# Patient Record
Sex: Female | Born: 1950 | Race: Black or African American | Hispanic: No | Smoking: Former smoker
Health system: Southern US, Community
[De-identification: ages and names within clinical notes are randomized; demographics above are authoritative.]

---

## 2005-04-23 ENCOUNTER — Ambulatory Visit: Payer: Self-pay | Admitting: Internal Medicine

## 2005-10-24 ENCOUNTER — Emergency Department: Payer: Self-pay | Admitting: Emergency Medicine

## 2006-06-05 ENCOUNTER — Ambulatory Visit: Payer: Self-pay | Admitting: Internal Medicine

## 2006-11-11 ENCOUNTER — Ambulatory Visit: Payer: Self-pay | Admitting: Gastroenterology

## 2007-02-14 ENCOUNTER — Ambulatory Visit: Payer: Self-pay | Admitting: Ophthalmology

## 2007-02-24 ENCOUNTER — Ambulatory Visit: Payer: Self-pay | Admitting: Ophthalmology

## 2007-07-14 ENCOUNTER — Ambulatory Visit: Payer: Self-pay | Admitting: Internal Medicine

## 2007-10-30 ENCOUNTER — Ambulatory Visit: Payer: Self-pay | Admitting: Internal Medicine

## 2008-06-01 ENCOUNTER — Ambulatory Visit: Payer: Self-pay | Admitting: Internal Medicine

## 2009-07-24 ENCOUNTER — Ambulatory Visit: Payer: Self-pay | Admitting: Internal Medicine

## 2010-03-08 ENCOUNTER — Ambulatory Visit: Payer: Self-pay | Admitting: Gastroenterology

## 2010-09-12 ENCOUNTER — Ambulatory Visit: Payer: Self-pay | Admitting: Internal Medicine

## 2011-03-26 ENCOUNTER — Ambulatory Visit: Payer: Self-pay | Admitting: Obstetrics and Gynecology

## 2011-10-21 ENCOUNTER — Ambulatory Visit: Payer: Self-pay | Admitting: Internal Medicine

## 2012-12-04 ENCOUNTER — Ambulatory Visit: Payer: Self-pay | Admitting: Internal Medicine

## 2013-02-23 ENCOUNTER — Ambulatory Visit: Payer: Self-pay | Admitting: Obstetrics and Gynecology

## 2014-01-28 ENCOUNTER — Ambulatory Visit: Payer: Self-pay | Admitting: Internal Medicine

## 2014-10-13 ENCOUNTER — Ambulatory Visit
Admit: 2014-10-13 | Disposition: A | Discharge status: Home / Self Care | Payer: Self-pay | Attending: Internal Medicine | Admitting: Internal Medicine

## 2015-04-05 ENCOUNTER — Other Ambulatory Visit: Payer: Self-pay | Admitting: Internal Medicine

## 2015-04-05 DIAGNOSIS — Z1239 Encounter for other screening for malignant neoplasm of breast: Secondary | ICD-10-CM

## 2015-04-21 ENCOUNTER — Ambulatory Visit
Admission: RE | Admit: 2015-04-21 | Discharge: 2015-04-21 | Disposition: A | Discharge status: Home / Self Care | Payer: Medicaid Other | Source: Ambulatory Visit | Attending: Internal Medicine | Admitting: Internal Medicine

## 2015-04-21 DIAGNOSIS — Z1239 Encounter for other screening for malignant neoplasm of breast: Secondary | ICD-10-CM

## 2015-04-21 DIAGNOSIS — Z1231 Encounter for screening mammogram for malignant neoplasm of breast: Secondary | ICD-10-CM | POA: Insufficient documentation

## 2016-04-01 ENCOUNTER — Other Ambulatory Visit: Payer: Self-pay | Admitting: Internal Medicine

## 2016-04-02 ENCOUNTER — Other Ambulatory Visit: Payer: Self-pay | Admitting: Internal Medicine

## 2016-04-02 DIAGNOSIS — Z1239 Encounter for other screening for malignant neoplasm of breast: Secondary | ICD-10-CM

## 2016-05-13 ENCOUNTER — Ambulatory Visit
Admission: RE | Admit: 2016-05-13 | Discharge: 2016-05-13 | Disposition: A | Discharge status: Home / Self Care | Payer: Medicaid Other | Source: Ambulatory Visit | Attending: Internal Medicine | Admitting: Internal Medicine

## 2016-05-13 DIAGNOSIS — Z1239 Encounter for other screening for malignant neoplasm of breast: Secondary | ICD-10-CM

## 2016-05-13 DIAGNOSIS — Z1231 Encounter for screening mammogram for malignant neoplasm of breast: Secondary | ICD-10-CM | POA: Insufficient documentation

## 2017-04-17 ENCOUNTER — Other Ambulatory Visit: Payer: Self-pay | Admitting: Internal Medicine

## 2017-04-17 DIAGNOSIS — Z1231 Encounter for screening mammogram for malignant neoplasm of breast: Secondary | ICD-10-CM

## 2017-05-16 ENCOUNTER — Ambulatory Visit
Admission: RE | Admit: 2017-05-16 | Discharge: 2017-05-16 | Disposition: A | Discharge status: Home / Self Care | Payer: Medicaid Other | Source: Ambulatory Visit | Attending: Internal Medicine | Admitting: Internal Medicine

## 2017-05-16 DIAGNOSIS — Z1231 Encounter for screening mammogram for malignant neoplasm of breast: Secondary | ICD-10-CM | POA: Diagnosis not present

## 2018-01-22 ENCOUNTER — Other Ambulatory Visit: Payer: Self-pay | Admitting: Obstetrics and Gynecology

## 2018-01-22 DIAGNOSIS — Z1231 Encounter for screening mammogram for malignant neoplasm of breast: Secondary | ICD-10-CM

## 2018-04-24 ENCOUNTER — Telehealth: Payer: Self-pay | Admitting: *Deleted

## 2018-04-24 DIAGNOSIS — Z87891 Personal history of nicotine dependence: Secondary | ICD-10-CM

## 2018-04-24 DIAGNOSIS — Z122 Encounter for screening for malignant neoplasm of respiratory organs: Secondary | ICD-10-CM

## 2018-04-24 NOTE — Telephone Encounter (Signed)
Received referral for initial lung cancer screening scan. Contacted patient and obtained smoking history,(former, quit 11/2017, 44 pack year) as well as answering questions related to screening process. Patient denies signs of lung cancer such as weight loss or hemoptysis. Patient denies comorbidity that would prevent curative treatment if lung cancer were found. Patient is scheduled for shared decision making visit and CT scan on 05/18/18 at 10am.

## 2018-04-24 NOTE — Telephone Encounter (Signed)
Received referral for low dose lung cancer screening CT scan. Message left at phone number listed in EMR for patient to call me back to facilitate scheduling scan.  

## 2018-05-15 ENCOUNTER — Encounter: Payer: Self-pay | Admitting: Nurse Practitioner

## 2018-05-18 ENCOUNTER — Inpatient Hospital Stay: Payer: Medicare Other | Attending: Nurse Practitioner | Admitting: Oncology

## 2018-05-18 ENCOUNTER — Ambulatory Visit
Admission: RE | Admit: 2018-05-18 | Discharge: 2018-05-18 | Disposition: A | Discharge status: Home / Self Care | Payer: Medicare Other | Source: Ambulatory Visit | Attending: Nurse Practitioner | Admitting: Nurse Practitioner

## 2018-05-18 DIAGNOSIS — R911 Solitary pulmonary nodule: Secondary | ICD-10-CM | POA: Insufficient documentation

## 2018-05-18 DIAGNOSIS — Z122 Encounter for screening for malignant neoplasm of respiratory organs: Secondary | ICD-10-CM | POA: Diagnosis present

## 2018-05-18 DIAGNOSIS — Z87891 Personal history of nicotine dependence: Secondary | ICD-10-CM

## 2018-05-18 NOTE — Progress Notes (Signed)
In accordance with CMS guidelines, patient has met eligibility criteria including age, absence of signs or symptoms of lung cancer.  Social History   Tobacco Use  . Smoking status: Former Smoker    Packs/day: 1.00    Years: 44.00    Pack years: 44.00    Types: Cigarettes    Last attempt to quit: 11/2017    Years since quitting: 0.4  Substance Use Topics  . Alcohol use: Not on file  . Drug use: Not on file     A shared decision-making session was conducted prior to the performance of CT scan. This includes one or more decision aids, includes benefits and harms of screening, follow-up diagnostic testing, over-diagnosis, false positive rate, and total radiation exposure.  Counseling on the importance of adherence to annual lung cancer LDCT screening, impact of co-morbidities, and ability or willingness to undergo diagnosis and treatment is imperative for compliance of the program.  Counseling on the importance of continued smoking cessation for former smokers; the importance of smoking cessation for current smokers, and information about tobacco cessation interventions have been given to patient including [place] and [employer] quit [place] programs.  Written order for lung cancer screening with LDCT has been given to the patient and any and all questions have been answered to the best of my abilities.   Yearly follow up will be coordinated by Burgess Estelle, Thoracic Navigator.  Faythe Casa, NP 05/18/2018 10:21 AM

## 2018-05-19 ENCOUNTER — Ambulatory Visit
Admission: RE | Admit: 2018-05-19 | Discharge: 2018-05-19 | Disposition: A | Discharge status: Home / Self Care | Payer: Medicare Other | Source: Ambulatory Visit | Attending: Obstetrics and Gynecology | Admitting: Obstetrics and Gynecology

## 2018-05-19 DIAGNOSIS — Z1231 Encounter for screening mammogram for malignant neoplasm of breast: Secondary | ICD-10-CM | POA: Diagnosis present

## 2018-05-20 ENCOUNTER — Telehealth: Payer: Self-pay | Admitting: *Deleted

## 2018-05-20 NOTE — Telephone Encounter (Signed)
Notified patient of LDCT lung cancer screening program results with recommendation for 12 month follow up imaging. Also notified of incidental findings noted below and is encouraged to discuss further with PCP who will receive a copy of this note and/or the CT report. Patient verbalizes understanding.   IMPRESSION: Lung-RADS 2, benign appearance or behavior. Continue annual screening with low-dose chest CT without contrast in 12 months 

## 2019-03-10 ENCOUNTER — Other Ambulatory Visit: Payer: Self-pay | Admitting: Internal Medicine

## 2019-03-10 DIAGNOSIS — Z1231 Encounter for screening mammogram for malignant neoplasm of breast: Secondary | ICD-10-CM

## 2019-05-12 ENCOUNTER — Telehealth: Payer: Self-pay

## 2019-05-12 NOTE — Telephone Encounter (Signed)
Left message for patient to notify them that it is time to schedule annual low dose lung cancer screening CT scan. Instructed patient to call back ([phone]) to verify information prior to the scan being scheduled.  

## 2019-05-21 ENCOUNTER — Ambulatory Visit
Admission: RE | Admit: 2019-05-21 | Discharge: 2019-05-21 | Disposition: A | Discharge status: Home / Self Care | Payer: Medicare Other | Source: Ambulatory Visit | Attending: Internal Medicine | Admitting: Internal Medicine

## 2019-05-21 DIAGNOSIS — Z1231 Encounter for screening mammogram for malignant neoplasm of breast: Secondary | ICD-10-CM | POA: Diagnosis not present

## 2019-05-26 ENCOUNTER — Telehealth: Payer: Self-pay

## 2019-05-26 NOTE — Telephone Encounter (Signed)
Left voicemail for pt to inform her that it is time for her annual lung cancer screening. Instructed pt to call back to confirm information prior to CT scan being scheduled. 

## 2019-06-23 ENCOUNTER — Telehealth: Payer: Self-pay | Admitting: *Deleted

## 2019-06-23 DIAGNOSIS — Z87891 Personal history of nicotine dependence: Secondary | ICD-10-CM

## 2019-06-23 NOTE — Telephone Encounter (Signed)
Left message for patient to notify them that it is time to schedule annual low dose lung cancer screening CT scan. Instructed patient to call back to verify information prior to the scan being scheduled.  

## 2019-06-23 NOTE — Telephone Encounter (Signed)
Patient has been notified that annual lung cancer screening low dose CT scan is due currently or will be in near future. Confirmed that patient is within the age range of 55-77, and asymptomatic, (no signs or symptoms of lung cancer). Patient denies illness that would prevent curative treatment for lung cancer if found. Verified smoking history, ( former, quit 6/19, 44 pack year). The shared decision making visit was done 05/18/18. Patient is agreeable for CT scan being scheduled.

## 2019-06-30 ENCOUNTER — Ambulatory Visit
Admission: RE | Admit: 2019-06-30 | Discharge: 2019-06-30 | Disposition: A | Discharge status: Home / Self Care | Payer: Medicare Other | Source: Ambulatory Visit | Attending: Oncology | Admitting: Oncology

## 2019-06-30 ENCOUNTER — Other Ambulatory Visit: Payer: Self-pay

## 2019-06-30 DIAGNOSIS — Z87891 Personal history of nicotine dependence: Secondary | ICD-10-CM | POA: Diagnosis present

## 2019-07-05 ENCOUNTER — Encounter: Payer: Self-pay | Admitting: *Deleted

## 2020-02-07 ENCOUNTER — Other Ambulatory Visit: Payer: Self-pay | Admitting: Internal Medicine

## 2020-02-07 DIAGNOSIS — Z1231 Encounter for screening mammogram for malignant neoplasm of breast: Secondary | ICD-10-CM

## 2020-06-19 ENCOUNTER — Telehealth: Payer: Self-pay | Admitting: *Deleted

## 2020-06-19 NOTE — Telephone Encounter (Signed)
Attempted to contact and schedule lung screening scan. Message left for patient to call back to schedule. 

## 2020-06-20 ENCOUNTER — Other Ambulatory Visit: Payer: Self-pay | Admitting: *Deleted

## 2020-06-20 DIAGNOSIS — Z122 Encounter for screening for malignant neoplasm of respiratory organs: Secondary | ICD-10-CM

## 2020-06-20 DIAGNOSIS — Z87891 Personal history of nicotine dependence: Secondary | ICD-10-CM

## 2020-06-20 NOTE — Progress Notes (Signed)
Contacted and scheduled for annual lung screening scan. Patient is a former smoker with a 44 pack year history. Quitdate is 11/29/2017

## 2020-07-06 ENCOUNTER — Other Ambulatory Visit: Payer: Self-pay

## 2020-07-06 ENCOUNTER — Ambulatory Visit
Admission: RE | Admit: 2020-07-06 | Discharge: 2020-07-06 | Disposition: A | Discharge status: Home / Self Care | Payer: Medicare Other | Source: Ambulatory Visit | Attending: Nurse Practitioner | Admitting: Nurse Practitioner

## 2020-07-06 DIAGNOSIS — Z122 Encounter for screening for malignant neoplasm of respiratory organs: Secondary | ICD-10-CM

## 2020-07-06 DIAGNOSIS — Z87891 Personal history of nicotine dependence: Secondary | ICD-10-CM | POA: Diagnosis not present

## 2020-07-07 ENCOUNTER — Encounter: Payer: Self-pay | Admitting: *Deleted

## 2020-07-07 NOTE — Progress Notes (Signed)
error 

## 2020-11-14 ENCOUNTER — Other Ambulatory Visit: Payer: Self-pay

## 2020-11-14 ENCOUNTER — Ambulatory Visit
Admission: RE | Admit: 2020-11-14 | Discharge: 2020-11-14 | Disposition: A | Discharge status: Home / Self Care | Payer: Medicare Other | Source: Ambulatory Visit | Attending: Internal Medicine | Admitting: Internal Medicine

## 2020-11-14 DIAGNOSIS — Z1231 Encounter for screening mammogram for malignant neoplasm of breast: Secondary | ICD-10-CM | POA: Diagnosis not present

## 2021-01-17 ENCOUNTER — Ambulatory Visit: Admit: 2021-01-17 | Payer: Medicare Other | Admitting: Internal Medicine

## 2021-01-17 SURGERY — COLONOSCOPY WITH PROPOFOL
Anesthesia: General

## 2021-08-30 ENCOUNTER — Telehealth: Payer: Self-pay | Admitting: *Deleted

## 2021-08-30 NOTE — Telephone Encounter (Signed)
LMTC to schedule yearly Lung CA CT Scan. ?

## 2021-08-31 ENCOUNTER — Other Ambulatory Visit: Payer: Self-pay

## 2021-08-31 DIAGNOSIS — Z87891 Personal history of nicotine dependence: Secondary | ICD-10-CM

## 2021-09-13 ENCOUNTER — Ambulatory Visit
Admission: RE | Admit: 2021-09-13 | Discharge: 2021-09-13 | Disposition: A | Discharge status: Home / Self Care | Payer: Medicare Other | Source: Ambulatory Visit | Attending: Acute Care | Admitting: Acute Care

## 2021-09-13 DIAGNOSIS — Z87891 Personal history of nicotine dependence: Secondary | ICD-10-CM

## 2021-09-17 ENCOUNTER — Other Ambulatory Visit: Payer: Self-pay

## 2021-09-17 DIAGNOSIS — Z87891 Personal history of nicotine dependence: Secondary | ICD-10-CM

## 2021-09-24 ENCOUNTER — Other Ambulatory Visit: Payer: Self-pay | Admitting: Obstetrics and Gynecology

## 2021-09-24 DIAGNOSIS — Z1231 Encounter for screening mammogram for malignant neoplasm of breast: Secondary | ICD-10-CM

## 2021-10-29 IMAGING — MG DIGITAL SCREENING BILAT W/ TOMO W/ CAD
8 series · 9 of 24 positions shown · non-contrast
Comparison: Previous exam(s).

CLINICAL DATA: Screening.

EXAM:
DIGITAL SCREENING BILATERAL MAMMOGRAM WITH TOMO AND CAD

[R MLO synth-2D]
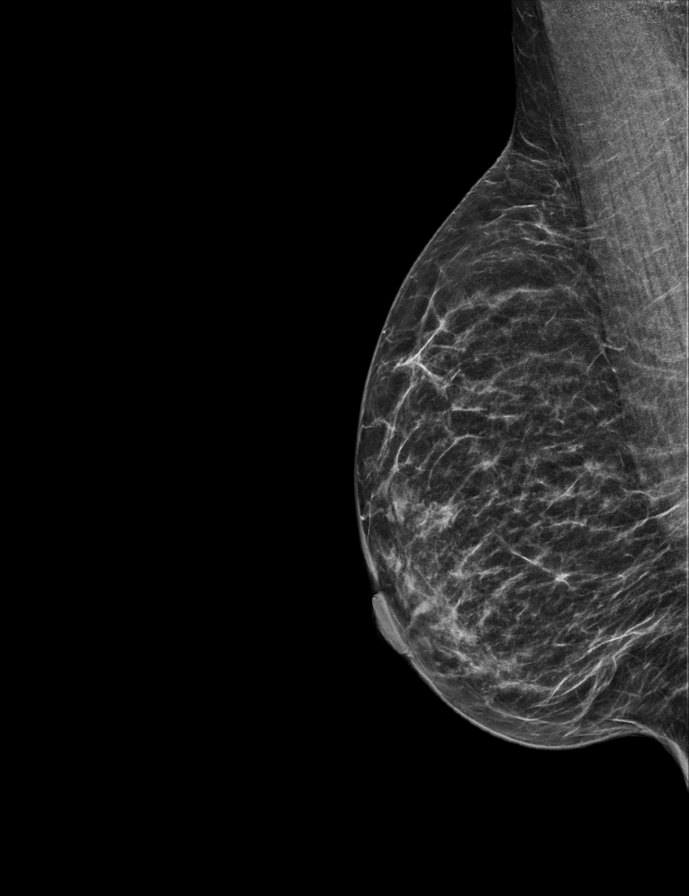

[R CC synth-2D]
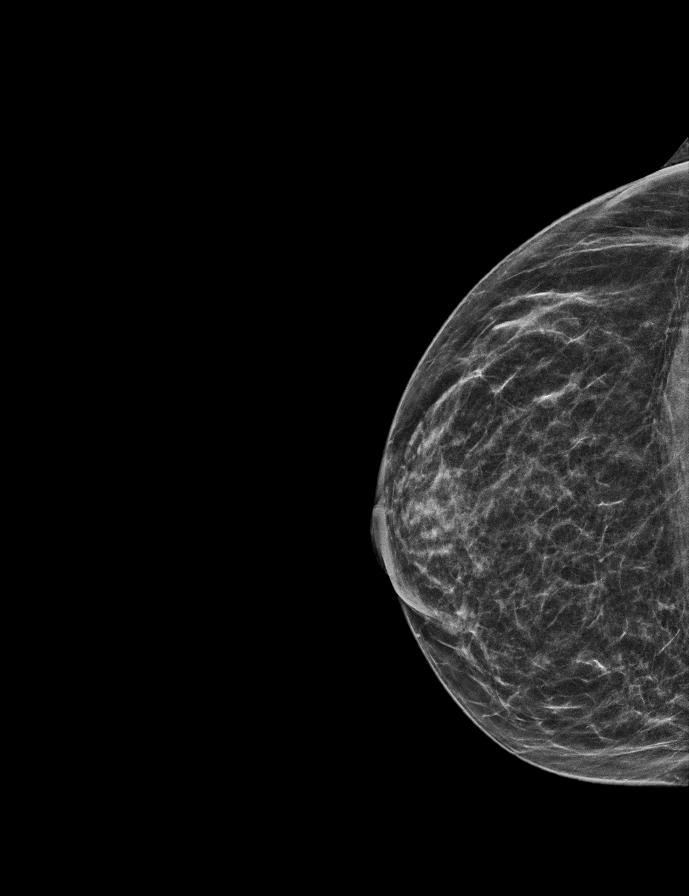

[L MLO synth-2D]
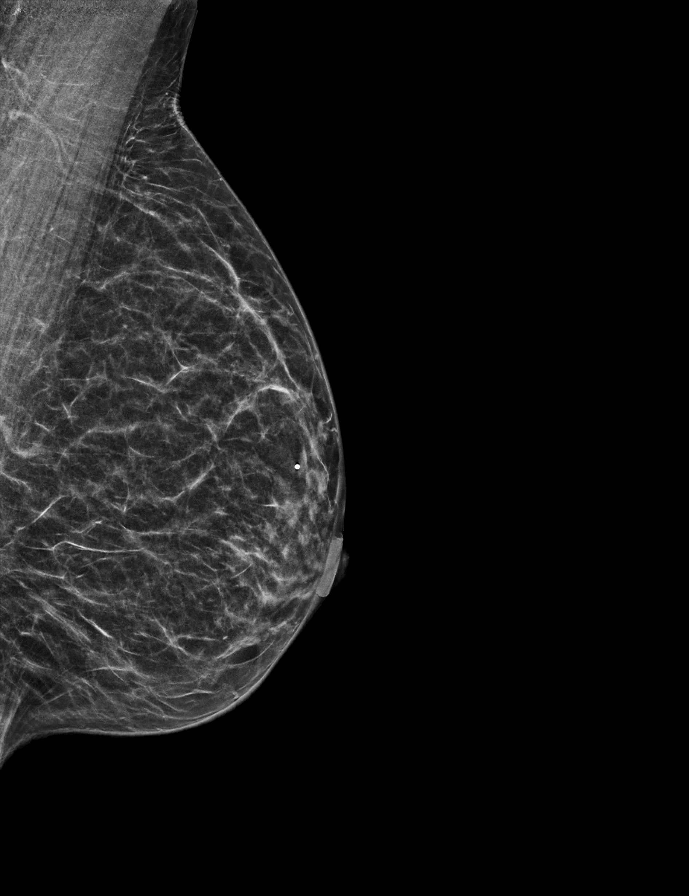

[L CC synth-2D]
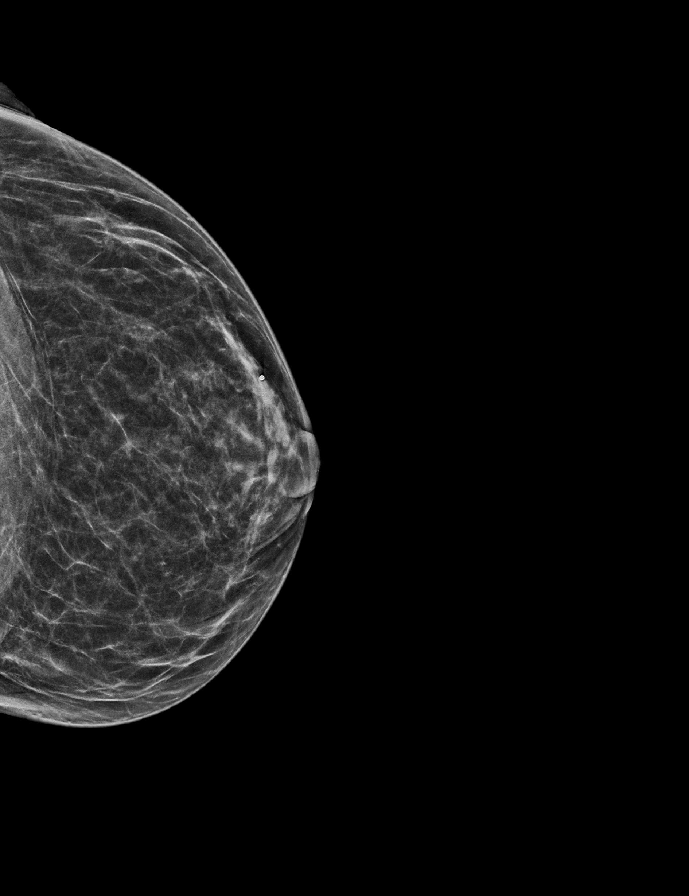

[R MLO tomo · 2 of 41 frames shown]
[frame 14/41]
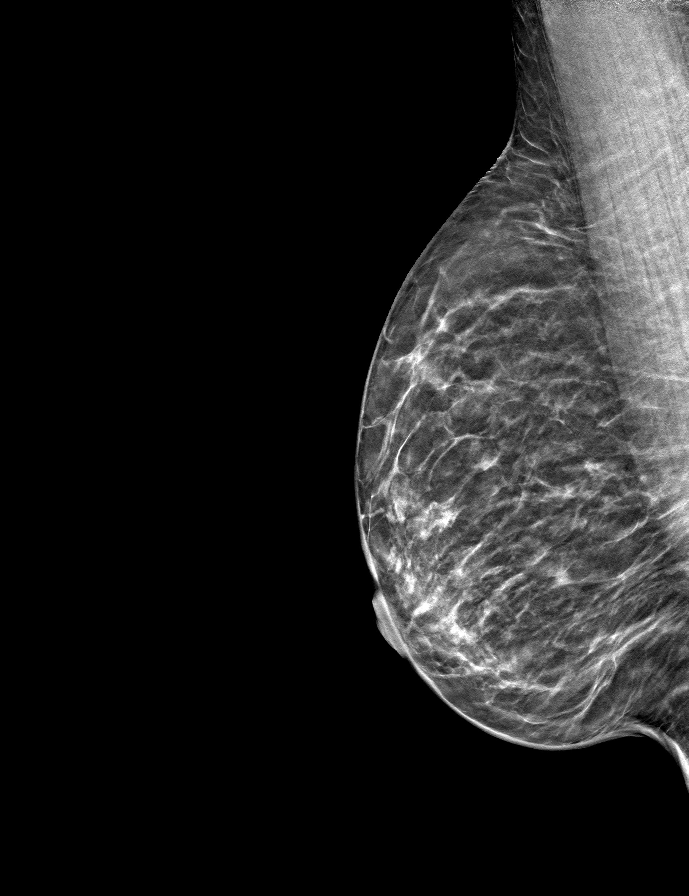
[frame 21/41]
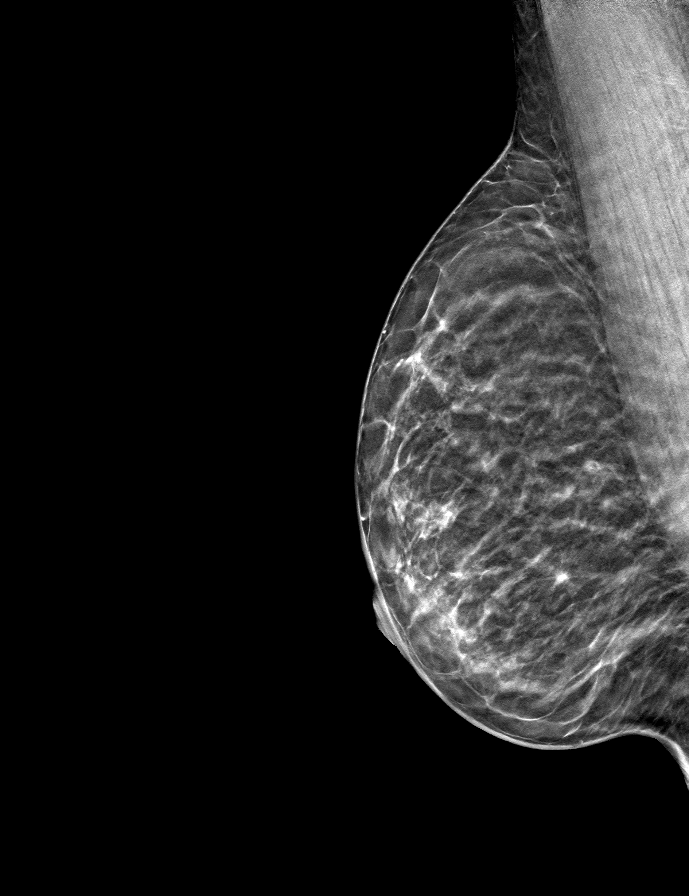

[L MLO tomo · tomo slice 21/40.0]
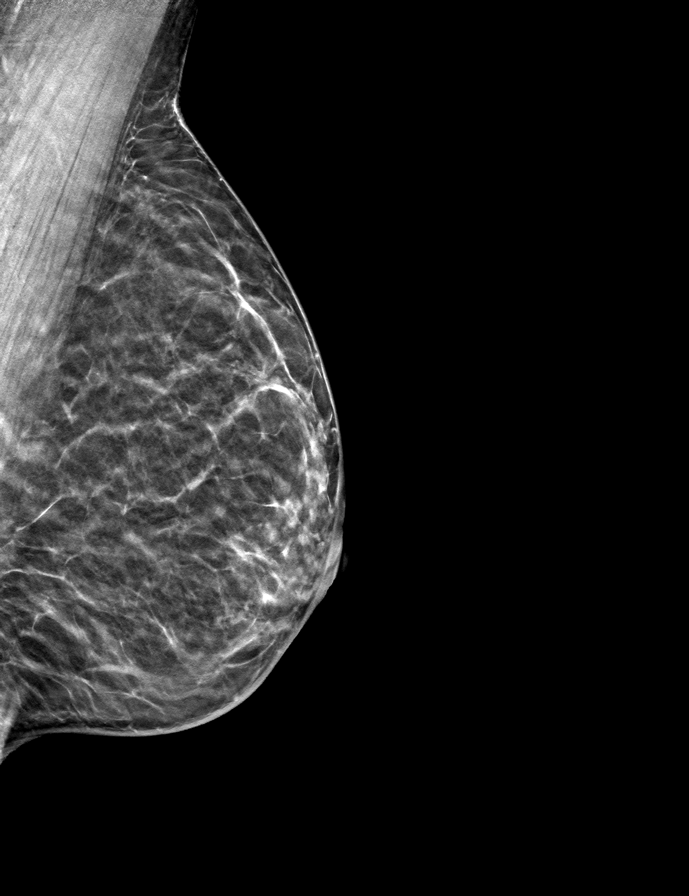

[R CC tomo · tomo slice 21/42.0]
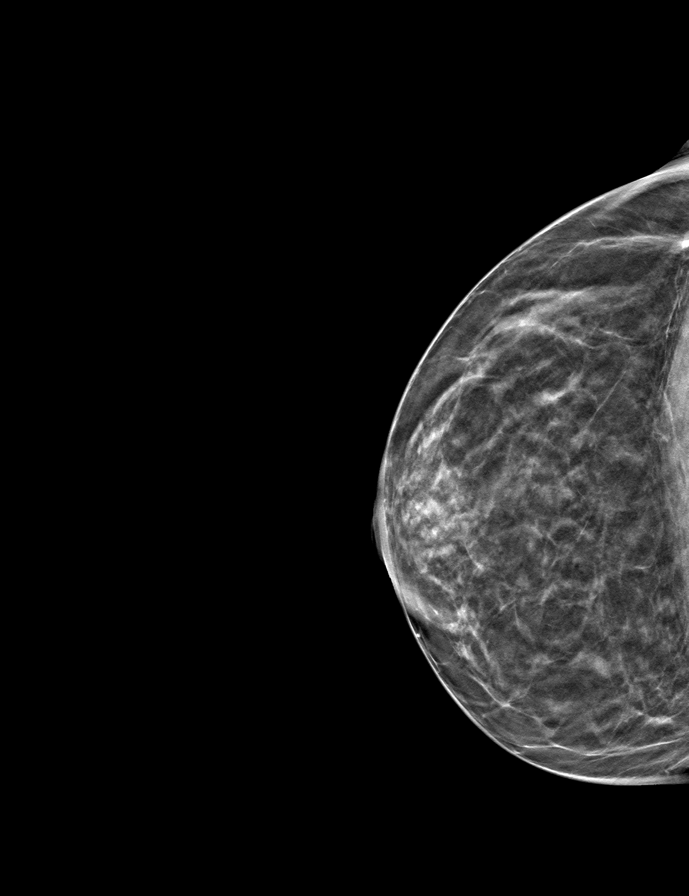

[L CC tomo · tomo slice 23/44.0]
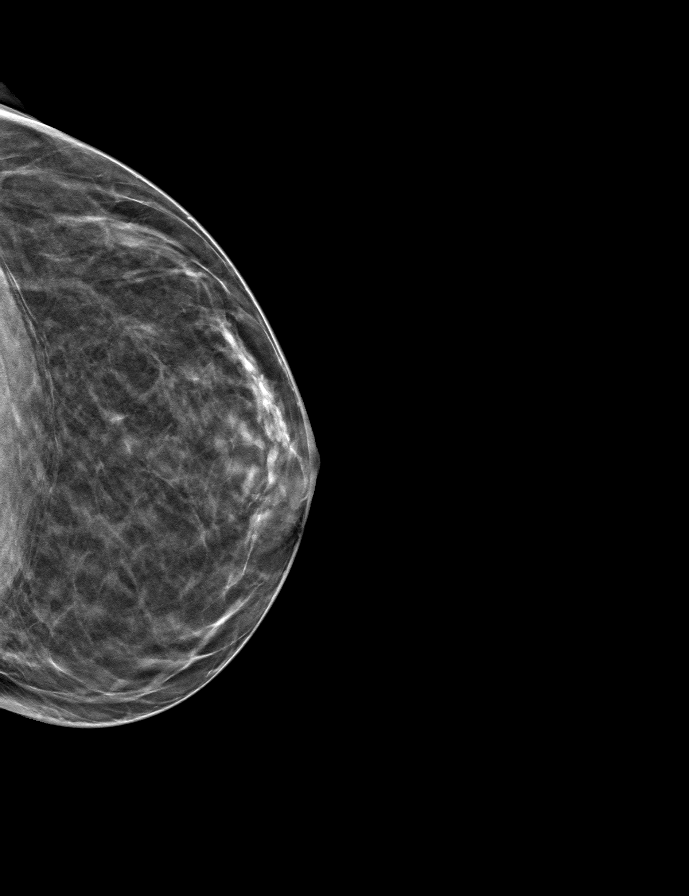

[9 of 24 positions shown; findings below may reference images not displayed]

ACR Breast Density Category c: The breast tissue is heterogeneously
dense, which may obscure small masses.
FINDINGS: There are no findings suspicious for malignancy. Images were
processed with CAD.
IMPRESSION: No mammographic evidence of malignancy. A result letter of this
screening mammogram will be mailed directly to the patient.

RECOMMENDATION:
Screening mammogram in one year. (Code:[id number])

BI-RADS CATEGORY  1: Negative.

## 2021-11-15 ENCOUNTER — Ambulatory Visit
Admission: RE | Admit: 2021-11-15 | Discharge: 2021-11-15 | Disposition: A | Discharge status: Home / Self Care | Payer: Medicare Other | Source: Ambulatory Visit | Attending: Obstetrics and Gynecology | Admitting: Obstetrics and Gynecology

## 2021-11-15 DIAGNOSIS — Z1231 Encounter for screening mammogram for malignant neoplasm of breast: Secondary | ICD-10-CM | POA: Insufficient documentation

## 2022-09-16 ENCOUNTER — Ambulatory Visit
Admission: RE | Admit: 2022-09-16 | Discharge: 2022-09-16 | Disposition: A | Discharge status: Home / Self Care | Payer: 59 | Source: Ambulatory Visit | Attending: Acute Care | Admitting: Acute Care

## 2022-09-16 DIAGNOSIS — Z87891 Personal history of nicotine dependence: Secondary | ICD-10-CM | POA: Diagnosis present

## 2022-09-19 ENCOUNTER — Other Ambulatory Visit: Payer: Self-pay

## 2022-09-19 DIAGNOSIS — Z87891 Personal history of nicotine dependence: Secondary | ICD-10-CM

## 2022-10-01 ENCOUNTER — Other Ambulatory Visit: Payer: Self-pay | Admitting: Obstetrics and Gynecology

## 2022-10-01 DIAGNOSIS — Z1231 Encounter for screening mammogram for malignant neoplasm of breast: Secondary | ICD-10-CM

## 2022-11-26 ENCOUNTER — Ambulatory Visit
Admission: RE | Admit: 2022-11-26 | Discharge: 2022-11-26 | Disposition: A | Discharge status: Home / Self Care | Payer: 59 | Source: Ambulatory Visit | Attending: Obstetrics and Gynecology | Admitting: Obstetrics and Gynecology

## 2022-11-26 DIAGNOSIS — Z1231 Encounter for screening mammogram for malignant neoplasm of breast: Secondary | ICD-10-CM | POA: Insufficient documentation

## 2023-09-17 ENCOUNTER — Ambulatory Visit
Admission: RE | Admit: 2023-09-17 | Discharge: 2023-09-17 | Disposition: A | Discharge status: Home / Self Care | Payer: 59 | Source: Ambulatory Visit | Attending: Acute Care | Admitting: Acute Care

## 2023-09-17 DIAGNOSIS — Z87891 Personal history of nicotine dependence: Secondary | ICD-10-CM | POA: Insufficient documentation

## 2023-10-24 ENCOUNTER — Other Ambulatory Visit: Payer: Self-pay | Admitting: Acute Care

## 2023-10-24 DIAGNOSIS — Z122 Encounter for screening for malignant neoplasm of respiratory organs: Secondary | ICD-10-CM

## 2023-10-24 DIAGNOSIS — Z87891 Personal history of nicotine dependence: Secondary | ICD-10-CM

## 2023-10-27 ENCOUNTER — Other Ambulatory Visit: Payer: Self-pay | Admitting: Internal Medicine

## 2023-10-27 DIAGNOSIS — Z1231 Encounter for screening mammogram for malignant neoplasm of breast: Secondary | ICD-10-CM

## 2023-12-02 ENCOUNTER — Ambulatory Visit
Admission: RE | Admit: 2023-12-02 | Discharge: 2023-12-02 | Disposition: A | Discharge status: Home / Self Care | Source: Ambulatory Visit | Attending: Internal Medicine | Admitting: Internal Medicine

## 2023-12-02 DIAGNOSIS — Z1231 Encounter for screening mammogram for malignant neoplasm of breast: Secondary | ICD-10-CM | POA: Diagnosis present

## 2024-09-17 ENCOUNTER — Ambulatory Visit

## 2024-09-21 ENCOUNTER — Ambulatory Visit
